# Patient Record
Sex: Female | Born: 2000 | Race: White | Hispanic: No | Marital: Single | Smoking: Never smoker
Health system: Southern US, Community
[De-identification: ages and names within clinical notes are randomized; demographics above are authoritative.]

## PROBLEM LIST (undated history)

## (undated) DIAGNOSIS — F32A Depression, unspecified: Secondary | ICD-10-CM

## (undated) DIAGNOSIS — F429 Obsessive-compulsive disorder, unspecified: Secondary | ICD-10-CM

## (undated) DIAGNOSIS — J45909 Unspecified asthma, uncomplicated: Secondary | ICD-10-CM

## (undated) DIAGNOSIS — F419 Anxiety disorder, unspecified: Secondary | ICD-10-CM

## (undated) HISTORY — DX: Anxiety disorder, unspecified: F41.9

## (undated) HISTORY — DX: Obsessive-compulsive disorder, unspecified: F42.9

## (undated) HISTORY — DX: Depression, unspecified: F32.A

---
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
## 2022-06-28 ENCOUNTER — Ambulatory Visit: Payer: Managed Care, Other (non HMO) | Admitting: Student

## 2022-06-28 ENCOUNTER — Encounter: Payer: Self-pay | Admitting: Student

## 2022-06-28 VITALS — BP 116/78 | HR 92 | Ht 68.0 in | Wt 119.2 lb

## 2022-06-28 DIAGNOSIS — F321 Major depressive disorder, single episode, moderate: Secondary | ICD-10-CM | POA: Insufficient documentation

## 2022-06-28 DIAGNOSIS — J45909 Unspecified asthma, uncomplicated: Secondary | ICD-10-CM | POA: Diagnosis not present

## 2022-06-28 MED ORDER — ALBUTEROL SULFATE HFA 108 (90 BASE) MCG/ACT IN AERS: 2.0000 | INHALATION_SPRAY | RESPIRATORY_TRACT | 0 refills | Status: AC | PRN

## 2022-06-28 MED ORDER — ESCITALOPRAM OXALATE 20 MG PO TABS: 20.0000 mg | ORAL_TABLET | Freq: Every day | ORAL | 2 refills | Status: DC

## 2022-06-28 NOTE — Progress Notes (Signed)
   Subjective:  Patient ID: Stacey Moses, female    DOB: 06-Oct-2000, 22 y.o.   MRN: [id number]  CC: New Patient  HPI:  Stacey Moses is a very pleasant 22 y.o. female who presents today to establish care. Had an episode of mania a few times during the course of several months (did not sleep for 2-3 days, was not herself/uninhibited at that time). This happened several years ago (3-4 years ago). Has not had an episode since. H/o OCD, depression, anxiety. Was receiving Lexapro 10 mg from her Pediatrician and seeing therapy; has been on the Lexapro for a long time and feels like it is not working as well as it used to.    PMHx: Past Medical History:  Diagnosis Date   Anxiety    Asthma    Depression    OCD (obsessive compulsive disorder)     Surgical Hx: History reviewed. No pertinent surgical history.  Family Hx: Stroke in both grandfathers HTN both grandfathers  Breast canncer in maternal grandmother  DM2 maternal GM Asthma   Social Hx: Current Social History  Who lives at home: Lives in an apartment, 2 roommates 06/28/2022  Transportation: Stable  06/28/2022 Important Relationships & Pets: Cat --Phoebe  06/28/2022  Current Stressors: Financial 06/28/2022 Work / Education:  Going to school for [employer] and art history 06/28/2022 Interests / Fun: Art work, tennis sometimes, sedentary 06/28/2022   Medications: Lexapro 10>20 mg   Preventative Screening Cigarettes -- socially 3 cigarettes a month approx  Alcohol occasionally No illicit drug use  Smoking status reviewed, advised   ROS: pertinent noted in the HPI   Objective:  BP 116/78   Pulse 92   Ht 5\' 8"  (1.727 m)   Wt 119 lb 4 oz (54.1 kg)   LMP 06/19/2022   SpO2 100%   BMI 18.13 kg/m  Vitals and nursing note reviewed  General: NAD, pleasant, able to participate in exam HEENT: normocephalic, good dentition without erythema  Neck: supple, non-tender, without lymphadenopathy Cardiac: RRR, S1 S2 present. normal heart sounds,  no murmurs. Respiratory: CTAB, normal effort, No wheezes, rales or rhonchi Abdomen: Normoactive bowel sounds, non-tender, non-distended, no hepatosplenomegaly Extremities: no edema or cyanosis. Skin: warm and dry, no rashes noted Neuro: alert, no obvious focal deficits Psych: Normal affect and mood  Assessment & Plan:  Depression, major, single episode, moderate (HCC) Assessment & Plan: Consider adding pharmacologic therapy at low dose and taper up if tolerated: Increase Lexapro 20 mg  Patient without SI/HI; they are safe to continue with outpatient treatment.  Close follow up 3 weeks, discussed risks of mania associated with SSRI, patient verbalized understanding and strict return precautions.  Psychiatry referral sent given history.     Orders: -     Escitalopram Oxalate; Take 1 tablet (20 mg total) by mouth daily.  Dispense: 30 tablet; Refill: 2 -     Ambulatory referral to Psychiatry   Allergies on Zyrtec  Asthma history: Albuterol   Return in about 3 weeks (around 07/19/2022). Alfredo Martinez, MD 06/28/2022, 3:47 PM PGY-2, [facility]

## 2022-06-28 NOTE — Patient Instructions (Addendum)
It was great to see you today! Thank you for choosing [facility] for your primary care. Stacey Moses was seen for new patient appt!!  Today we addressed: Increase Lexapro to 20 mg, watch for any signs of manic symptoms! Come back to see me in 2-3 weeks   If you haven't already, sign up for My Chart to have easy access to your labs results, and communication with your primary care physician.  I recommend that you always bring your medications to each appointment as this makes it easy to ensure you are on the correct medications and helps [place] not miss refills when you need them. Call the clinic at [phone] if your symptoms worsen or you have any concerns.  You should return to our clinic Return in about 3 weeks (around 07/19/2022). Please arrive 15 minutes before your appointment to ensure smooth check in process.  We appreciate your efforts in making this happen.  Thank you for allowing me to participate in your care, Alfredo Martinez, MD 06/28/2022, 3:11 PM PGY-2, [facility]

## 2022-06-28 NOTE — Assessment & Plan Note (Signed)
Consider adding pharmacologic therapy at low dose and taper up if tolerated: Increase Lexapro 20 mg  Patient without SI/HI; they are safe to continue with outpatient treatment.  Close follow up 3 weeks, discussed risks of mania associated with SSRI, patient verbalized understanding and strict return precautions.  Psychiatry referral sent given history.

## 2022-07-20 ENCOUNTER — Other Ambulatory Visit: Payer: Self-pay | Admitting: Student

## 2022-07-20 DIAGNOSIS — F321 Major depressive disorder, single episode, moderate: Secondary | ICD-10-CM

## 2022-10-24 ENCOUNTER — Ambulatory Visit (INDEPENDENT_AMBULATORY_CARE_PROVIDER_SITE_OTHER): Payer: Managed Care, Other (non HMO)

## 2022-10-24 ENCOUNTER — Encounter (HOSPITAL_COMMUNITY): Payer: Self-pay

## 2022-10-24 ENCOUNTER — Ambulatory Visit (HOSPITAL_COMMUNITY)
Admission: EM | Admit: 2022-10-24 | Discharge: 2022-10-24 | Disposition: A | Discharge status: Home / Self Care | Payer: Managed Care, Other (non HMO) | Attending: Emergency Medicine | Admitting: Emergency Medicine

## 2022-10-24 DIAGNOSIS — S060X0A Concussion without loss of consciousness, initial encounter: Secondary | ICD-10-CM | POA: Diagnosis not present

## 2022-10-24 DIAGNOSIS — M79644 Pain in right finger(s): Secondary | ICD-10-CM

## 2022-10-24 DIAGNOSIS — S161XXA Strain of muscle, fascia and tendon at neck level, initial encounter: Secondary | ICD-10-CM | POA: Diagnosis not present

## 2022-10-24 MED ORDER — METHOCARBAMOL 500 MG PO TABS: 500.0000 mg | ORAL_TABLET | Freq: Two times a day (BID) | ORAL | 0 refills | Status: AC
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
## 2022-10-24 NOTE — Discharge Instructions (Addendum)
The radiologist did not see any fractures or breaks in your finger. You can take 600-800 mg of ibuprofen every 8 hours for pain and inflammation.  For musculoskeletal pain you can do gentle stretching, heat and the muscle relaxer up to twice daily.  Do not drink or drive on this medication as it may cause drowsiness.  I suspect you have a concussion.  Please do not listen to any loud music, stay away from your phone, and avoid watching TV or anything that strains your eyes.  You can gradually return to activities as tolerated.  If your musculoskeletal pain persist beyond the next week, please follow-up with sports medicine for further evaluation.  Seek immediate care if you develop any loss of consciousness, vomiting, or new concerning symptoms.

## 2022-10-30 ENCOUNTER — Ambulatory Visit (INDEPENDENT_AMBULATORY_CARE_PROVIDER_SITE_OTHER): Payer: Managed Care, Other (non HMO)

## 2022-10-30 ENCOUNTER — Encounter (HOSPITAL_COMMUNITY): Payer: Self-pay

## 2022-10-30 ENCOUNTER — Ambulatory Visit (HOSPITAL_COMMUNITY)
Admission: EM | Admit: 2022-10-30 | Discharge: 2022-10-30 | Disposition: A | Discharge status: Home / Self Care | Payer: Managed Care, Other (non HMO) | Attending: Family Medicine | Admitting: Family Medicine

## 2022-10-30 DIAGNOSIS — M940 Chondrocostal junction syndrome [Tietze]: Secondary | ICD-10-CM

## 2022-10-30 DIAGNOSIS — R0782 Intercostal pain: Secondary | ICD-10-CM

## 2022-10-30 MED ORDER — TRAMADOL HCL 50 MG PO TABS: 50.0000 mg | ORAL_TABLET | Freq: Every evening | ORAL | 0 refills | Status: AC | PRN
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
## 2022-12-20 ENCOUNTER — Ambulatory Visit: Payer: Managed Care, Other (non HMO) | Admitting: Student

## 2022-12-20 VITALS — BP 98/62 | HR 79 | Ht 68.0 in | Wt 122.4 lb

## 2022-12-20 DIAGNOSIS — F321 Major depressive disorder, single episode, moderate: Secondary | ICD-10-CM | POA: Diagnosis not present

## 2022-12-20 DIAGNOSIS — Z3009 Encounter for other general counseling and advice on contraception: Secondary | ICD-10-CM | POA: Diagnosis not present

## 2022-12-20 MED ORDER — HYDROXYZINE HCL 10 MG PO TABS: 10.0000 mg | ORAL_TABLET | Freq: Three times a day (TID) | ORAL | 0 refills | Status: AC | PRN

## 2022-12-20 MED ORDER — ESCITALOPRAM OXALATE 20 MG PO TABS: 20.0000 mg | ORAL_TABLET | Freq: Every day | ORAL | 2 refills | Status: DC

## 2022-12-20 NOTE — Progress Notes (Signed)
    SUBJECTIVE:   CHIEF COMPLAINT / HPI:   Discuss Changing Medications  -Previously on Lexapro 20 mg  -Previously sent to psychiatry for assistance  -Would like a refill, feels that the Lexapro does help somewhat but she has persistent anxiety -She would like to know if there is an option for as needed anxiety medication, reports that her mom has taken a benzodiazepine in the past   Per note from 06/28/2022: Had an episode of mania a few times during the course of several months (did not sleep for 2-3 days, was not herself/uninhibited at that time). This happened several years ago (3-4 years ago). Has not had an episode since. H/o OCD, depression, anxiety. Was receiving Lexapro 10 mg from her Pediatrician and seeing therapy; has been on the Lexapro for a long time and feels like it is not working as well as it used to.   Has not yet seen psychiatry. Needs another referral  Patient is moving back to [place] for school.   Contraception Counseling: -Patient would like to continue with an IUD but would like anesthesia prior to receiving -She does report that she had seen an OB that would provide this for her. -Does not remember the name of the OB -Has OCP  PERTINENT  PMH / PSH:  Depression  Asthma   OBJECTIVE:   BP 98/62   Pulse 79   Ht 5\' 8"  (1.727 m)   Wt 122 lb 6.4 oz (55.5 kg)   SpO2 99%   BMI 18.61 kg/m   General: Alert and oriented in no apparent distress Heart: Regular rate and rhythm with no murmurs appreciated Lungs: Normal WOB Skin: Warm and dry  ASSESSMENT/PLAN:   Assessment & Plan Depression, major, single episode, moderate (HCC) Consider adding pharmacologic therapy at low dose and taper up if tolerated: We will continue with Lexapro 20 mg and have added hydroxyzine to use as needed up to 3 times a day.  If she needs this more frequently, would like for her to be seen by psychiatry given her history of manic episodes per patient. Therapist -patient has a  therapist that she uses Patient without SI/HI; denies active plan and reports that they are safe to continue with outpatient treatment.  Close follow up, instructed to update me within the next 3 to 4 weeks Additionally, placed another referral to psychiatry as I feel she needs this visit given her history that is slightly more complex  Birth control counseling Gust the benefits of IUD especially changes in menstrual cycle.  I will place a referral to OB but I am unsure about the locations that would provide anesthesia for IUD placement.  We do not provide that here, this was discussed with the patient.  Continue with OCPs in the meantime.     Alfredo Martinez, MD [facility]

## 2022-12-20 NOTE — Patient Instructions (Signed)
It was great to see you today! Thank you for choosing [facility] for your primary care.  Today we addressed: I have ordered the lexapro Also ordered hydroxyzine to take as needed up to three times a day Please call ob offices to see if they provide IUD procedures and I will place a referral   If you haven't already, sign up for My Chart to have easy access to your labs results, and communication with your primary care physician. I recommend that you always bring your medications to each appointment as this makes it easy to ensure you are on the correct medications and helps [place] not miss refills when you need them. Call the clinic at [phone] if your symptoms worsen or you have any concerns.  Please arrive 15 minutes before your appointment to ensure smooth check in process.  We appreciate your efforts in making this happen.  Thank you for allowing me to participate in your care, Alfredo Martinez, MD 12/20/2022, 1:03 PM PGY-3, [facility]

## 2022-12-20 NOTE — Assessment & Plan Note (Signed)
Consider adding pharmacologic therapy at low dose and taper up if tolerated: We will continue with Lexapro 20 mg and have added hydroxyzine to use as needed up to 3 times a day.  If she needs this more frequently, would like for her to be seen by psychiatry given her history of manic episodes per patient. Therapist -patient has a therapist that she uses Patient without SI/HI; denies active plan and reports that they are safe to continue with outpatient treatment.  Close follow up, instructed to update me within the next 3 to 4 weeks Additionally, placed another referral to psychiatry as I feel she needs this visit given her history that is slightly more complex

## 2023-07-05 ENCOUNTER — Encounter: Payer: Self-pay | Admitting: *Deleted

## 2023-10-05 ENCOUNTER — Other Ambulatory Visit: Payer: Self-pay

## 2023-10-05 DIAGNOSIS — F321 Major depressive disorder, single episode, moderate: Secondary | ICD-10-CM

## 2023-10-05 MED ORDER — ESCITALOPRAM OXALATE 20 MG PO TABS: 20.0000 mg | ORAL_TABLET | Freq: Every day | ORAL | 2 refills | Status: AC
